# Patient Record
Sex: Male | Born: 1970 | Race: Black or African American | Hispanic: No | Marital: Single | Smoking: Current every day smoker
Health system: Southern US, Community
[De-identification: ages and names within clinical notes are randomized; demographics above are authoritative.]

## PROBLEM LIST (undated history)

## (undated) DIAGNOSIS — C801 Malignant (primary) neoplasm, unspecified: Secondary | ICD-10-CM

## (undated) DIAGNOSIS — I10 Essential (primary) hypertension: Secondary | ICD-10-CM

---

## 2007-06-28 ENCOUNTER — Emergency Department (HOSPITAL_COMMUNITY): Admission: EM | Admit: 2007-06-28 | Discharge: 2007-06-28 | Payer: Self-pay | Admitting: Family Medicine
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
## 2008-10-02 ENCOUNTER — Emergency Department (HOSPITAL_COMMUNITY): Admission: EM | Admit: 2008-10-02 | Discharge: 2008-10-02 | Payer: Self-pay | Admitting: Family Medicine
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
## 2014-02-28 ENCOUNTER — Encounter (HOSPITAL_COMMUNITY): Payer: Self-pay | Admitting: Emergency Medicine

## 2014-02-28 ENCOUNTER — Emergency Department (HOSPITAL_COMMUNITY)
Admission: EM | Admit: 2014-02-28 | Discharge: 2014-03-01 | Disposition: A | Discharge status: Home / Self Care | Payer: Self-pay | Attending: Emergency Medicine | Admitting: Emergency Medicine

## 2014-02-28 DIAGNOSIS — F17200 Nicotine dependence, unspecified, uncomplicated: Secondary | ICD-10-CM | POA: Insufficient documentation

## 2014-02-28 DIAGNOSIS — S058X9A Other injuries of unspecified eye and orbit, initial encounter: Secondary | ICD-10-CM | POA: Insufficient documentation

## 2014-02-28 DIAGNOSIS — R011 Cardiac murmur, unspecified: Secondary | ICD-10-CM | POA: Insufficient documentation

## 2014-02-28 DIAGNOSIS — Y929 Unspecified place or not applicable: Secondary | ICD-10-CM | POA: Insufficient documentation

## 2014-02-28 DIAGNOSIS — IMO0002 Reserved for concepts with insufficient information to code with codable children: Secondary | ICD-10-CM | POA: Insufficient documentation

## 2014-02-28 DIAGNOSIS — S0502XA Injury of conjunctiva and corneal abrasion without foreign body, left eye, initial encounter: Secondary | ICD-10-CM

## 2014-02-28 DIAGNOSIS — Y9389 Activity, other specified: Secondary | ICD-10-CM | POA: Insufficient documentation

## 2014-02-28 DIAGNOSIS — H10213 Acute toxic conjunctivitis, bilateral: Secondary | ICD-10-CM

## 2014-02-28 DIAGNOSIS — H10219 Acute toxic conjunctivitis, unspecified eye: Secondary | ICD-10-CM | POA: Insufficient documentation

## 2014-02-28 MED ORDER — SODIUM CHLORIDE 0.9 % IV SOLN
INTRAVENOUS | Status: AC
Start: 2014-02-28 — End: 2014-03-01
  Administered 2014-02-28: 2000 mL via INTRAVENOUS

## 2014-02-28 MED ORDER — TETRACAINE HCL 0.5 % OP SOLN
2.0000 [drp] | Freq: Once | OPHTHALMIC | Status: AC
  Administered 2014-02-28: 2 [drp] via OPHTHALMIC
  Filled 2014-02-28: qty 2

## 2014-02-28 NOTE — ED Notes (Signed)
pH of both eyes checked.  pH is between 7 and 8 in each eye
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
## 2014-02-28 NOTE — ED Notes (Signed)
Patient states that he was cleaning the outside of a house with Awesome and states that the mist got in his eyes. States that his eyes began bothering him this evening.
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
## 2014-03-01 MED ORDER — HYDROCODONE-ACETAMINOPHEN 5-325 MG PO TABS: 1.0000 | ORAL_TABLET | ORAL | Status: DC | PRN

## 2014-03-01 MED ORDER — ERYTHROMYCIN 5 MG/GM OP OINT
TOPICAL_OINTMENT | Freq: Four times a day (QID) | OPHTHALMIC | Status: DC
  Administered 2014-03-01: 1 via OPHTHALMIC
  Filled 2014-03-01: qty 1

## 2014-03-01 MED ORDER — FLUORESCEIN SODIUM 1 MG OP STRP
1.0000 | ORAL_STRIP | Freq: Once | OPHTHALMIC | Status: DC
  Filled 2014-03-01: qty 1

## 2014-03-01 MED ORDER — PREDNISOLONE ACETATE 1 % OP SUSP
1.0000 [drp] | OPHTHALMIC | Status: DC
  Administered 2014-03-01: 1 [drp] via OPHTHALMIC
  Filled 2014-03-01: qty 1

## 2014-03-01 NOTE — Discharge Instructions (Signed)
Chemical Conjunctivitis Chemical conjunctivitis is an irritation of the underside of the eyelid and the white part of the eye. Conjunctivitis can be caused by infection, allergy or chemical irritation. In your case it has been caused by a chemical irritation of the eye. Symptoms almost always include: tearing, light sensitivity, gritty feeling (sensation) in the eyes, swelling of your eyelids, and often severe pain. In spite of the severe pain, this irritation will run its course and will improve within 24 hours.  HOME CARE INSTRUCTIONS   To ease discomfort apply a cool, clean wash cloth to your eye for 10 to 20 minutes, 3 to 4 times per day.  Do not rub your eyes.  Gently wipe away any discharge from the eyes with moistened tissues.  Wash your hands often with soap and use paper towels to dry.  Sunglasses may be helpful if light bothers your eyes.  Do not use eye make-up.  Do not use contact lenses until the irritation is gone.  Do not operate machinery or drive if your vision is blurred.  Take medications as directed by your caregiver. Artificial tears may ease discomfort.  Avoid the chemical or surroundings which caused the problem. Always use eye protection as necessary. SEEK MEDICAL CARE IF:   The eye is still pink (inflamed) 3 days after beginning treatment.  Pain in the eye increases.  You have discharge coming from either eye.  Your eyelids are stuck together in the morning.  You have an increased sensitivity to light.  An oral temperature above 102 F (38.9 C) develops.  You develop facial pain.  You have any problems that may be related to the medicine you are taking. SEEK IMMEDIATE MEDICAL CARE IF:   Your vision is getting worse.  You develop severe eye pain. MAKE SURE YOU:   Understand these instructions.  Will watch your condition.  Will get help right away if you are not doing well or get worse. Document Released: 07/17/2005 Document Revised:  12/30/2011 Document Reviewed: 05/25/2008 [facility] Patient Information 2014 [place]. Corneal Abrasion The cornea is the clear covering at the front and center of the eye. When looking at the colored portion of the eye (iris), you are looking through the cornea. This very thin tissue is made up of many layers. The surface layer is a single layer of cells (corneal epithelium) and is one of the most sensitive tissues in the body. If a scratch or injury causes the corneal epithelium to come off, it is called a corneal abrasion. If the injury extends to the tissues below the epithelium, the condition is called a corneal ulcer. CAUSES   Scratches.  Trauma.  Foreign body in the eye. Some people have recurrences of abrasions in the area of the original injury even after it has healed (recurrent erosion syndrome). Recurrent erosion syndrome generally improves and goes away with time. SYMPTOMS   Eye pain.  Difficulty or inability to keep the injured eye open.  The eye becomes very sensitive to light.  Recurrent erosions tend to happen suddenly, first thing in the morning, usually after waking up and opening the eye. DIAGNOSIS  Your health care provider can diagnose a corneal abrasion during an eye exam. Dye is usually placed in the eye using a drop or a small paper strip moistened by your tears. When the eye is examined with a special light, the abrasion shows up clearly because of the dye. TREATMENT   Small abrasions may be treated with antibiotic drops or  ointment alone.  Usually a pressure patch is specially applied. Pressure patches prevent the eye from blinking, allowing the corneal epithelium to heal. A pressure patch also reduces the amount of pain present in the eye during healing. Most corneal abrasions heal within 2 3 days with no effect on vision. If the abrasion becomes infected and spreads to the deeper tissues of the cornea, a corneal ulcer can result. This is serious because  it can cause corneal scarring. Corneal scars interfere with light passing through the cornea and cause a loss of vision in the involved eye. HOME CARE INSTRUCTIONS  Use medicine or ointment as directed. Only take over-the-counter or prescription medicines for pain, discomfort, or fever as directed by your health care provider.  Do not drive or operate machinery while your eye is patched. Your ability to judge distances is impaired.  If your health care provider has given you a follow-up appointment, it is very important to keep that appointment. Not keeping the appointment could result in a severe eye infection or permanent loss of vision. If there is any problem keeping the appointment, let your health care provider know. SEEK MEDICAL CARE IF:   You have pain, light sensitivity, and a scratchy feeling in one eye or both eyes.  Your pressure patch keeps loosening up, and you can blink your eye under the patch after treatment.  Any kind of discharge develops from the eye after treatment or if the lids stick together in the morning.  You have the same symptoms in the morning as you did with the original abrasion days, weeks, or months after the abrasion healed. MAKE SURE YOU:   Understand these instructions.  Will watch your condition.  Will get help right away if you are not doing well or get worse. Document Released: 10/04/2000 Document Revised: 07/28/2013 Document Reviewed: 06/14/2013 [facility] Patient Information 2014 [place].

## 2014-03-01 NOTE — ED Notes (Signed)
Discharge instructions given to family member.  Voiced understanding.

## 2014-03-01 NOTE — ED Notes (Signed)
Waiting on medication from the pharmacy 

## 2014-03-03 NOTE — ED Provider Notes (Signed)
  Medical screening examination/treatment/procedure(s) were performed by non-physician practitioner and as supervising physician I was immediately available for consultation/collaboration.   EKG Interpretation None         Carmin Muskrat, MD 03/03/14 [phone]

## 2021-11-21 ENCOUNTER — Encounter (HOSPITAL_BASED_OUTPATIENT_CLINIC_OR_DEPARTMENT_OTHER): Payer: Self-pay | Admitting: *Deleted

## 2021-11-21 ENCOUNTER — Other Ambulatory Visit: Payer: Self-pay

## 2021-11-21 ENCOUNTER — Emergency Department (HOSPITAL_BASED_OUTPATIENT_CLINIC_OR_DEPARTMENT_OTHER): Payer: Self-pay

## 2021-11-21 ENCOUNTER — Emergency Department (HOSPITAL_BASED_OUTPATIENT_CLINIC_OR_DEPARTMENT_OTHER)
Admission: EM | Admit: 2021-11-21 | Discharge: 2021-11-21 | Disposition: A | Discharge status: Home / Self Care | Payer: Self-pay | Attending: Emergency Medicine | Admitting: Emergency Medicine

## 2021-11-21 DIAGNOSIS — K859 Acute pancreatitis without necrosis or infection, unspecified: Secondary | ICD-10-CM | POA: Insufficient documentation

## 2021-11-21 LAB — CBC
HCT: 41 % (ref 39.0–52.0)
Hemoglobin: 13.7 g/dL (ref 13.0–17.0)
MCH: 27.4 pg (ref 26.0–34.0)
MCHC: 33.4 g/dL (ref 30.0–36.0)
MCV: 82 fL (ref 80.0–100.0)
Platelets: 287 10*3/uL (ref 150–400)
RBC: 5 MIL/uL (ref 4.22–5.81)
RDW: 14.6 % (ref 11.5–15.5)
WBC: 5.6 10*3/uL (ref 4.0–10.5)
nRBC: 0 % (ref 0.0–0.2)

## 2021-11-21 LAB — COMPREHENSIVE METABOLIC PANEL
ALT: 1187 U/L — ABNORMAL HIGH (ref 0–44)
AST: 537 U/L — ABNORMAL HIGH (ref 15–41)
Albumin: 4.2 g/dL (ref 3.5–5.0)
Alkaline Phosphatase: 656 U/L — ABNORMAL HIGH (ref 38–126)
Anion gap: 11 (ref 5–15)
BUN: 9 mg/dL (ref 6–20)
CO2: 26 mmol/L (ref 22–32)
Calcium: 9.8 mg/dL (ref 8.9–10.3)
Chloride: 97 mmol/L — ABNORMAL LOW (ref 98–111)
Creatinine, Ser: 0.78 mg/dL (ref 0.61–1.24)
GFR, Estimated: >60 mL/min (ref >60)
Glucose, Bld: 329 mg/dL — ABNORMAL HIGH (ref 70–99)
Potassium: 3.4 mmol/L — ABNORMAL LOW (ref 3.5–5.1)
Sodium: 134 mmol/L — ABNORMAL LOW (ref 135–145)
Total Bilirubin: 5.2 mg/dL — ABNORMAL HIGH (ref 0.3–1.2)
Total Protein: 7.9 g/dL (ref 6.5–8.1)

## 2021-11-21 LAB — LIPASE, BLOOD: Lipase: 279 U/L — ABNORMAL HIGH (ref 11–51)

## 2021-11-21 MED ORDER — IOHEXOL 300 MG/ML  SOLN
100.0000 mL | Freq: Once | INTRAMUSCULAR | Status: AC | PRN
  Administered 2021-11-21: 100 mL via INTRAVENOUS

## 2021-11-21 NOTE — Discharge Instructions (Addendum)
Advise a clear liquid diet for the next 7 days until seen by the GI doctor.  Return to the ER if you have fevers worsening pain, cannot keep down any fluids or have any additional concerns.

## 2021-11-21 NOTE — ED Triage Notes (Signed)
Pt is here for evaluation for abdominal pain x1 month.  Pain is waxing and weaning and hurts more at night.  Pt reports that this feels like a hunger pain but when he eats he becomes nauseated and has vomiting.  Pt reports an estimated weight loss of 20lbs and that he has only been able to keep down water and crackers and soup for the past month.  Pt reports last bm was this am and he has has regular BM with some formed and some diarrhea. No fever with this.

## 2021-11-21 NOTE — ED Provider Notes (Signed)
[place] EMERGENCY DEPT Provider Note   CSN: [id number] Arrival date & time: 11/21/21  0900     History  Chief Complaint  Patient presents with   Abdominal Pain    Collin Johnson. is a 51 y.o. male.  Patient presents with chief complaint of abdominal pain.  He states that this pain has been intermittent for the past month.  Aspiration of this pain a few days ago and presents to the ER.  Currently denies any pain he states he has not had any pain today.  He states he lost about 20 pounds of weight in the past month.  He has had intermittent vomiting but no diarrhea.  Denies any vomiting today denies any fevers or cough or diarrhea.      Home Medications Prior to Admission medications   Medication Sig Start Date End Date Taking? Authorizing Provider  HYDROcodone-acetaminophen (NORCO/VICODIN) 5-325 MG per tablet Take 1-2 tablets by mouth every 4 (four) hours as needed for severe pain. 03/01/14   Etta Quill, NP  Pseudoephedrine-Acetaminophen (SINUS PO) Take 1 tablet by mouth 2 (two) times daily as needed (for congestion).    [provider]      Allergies    Patient has no known allergies.    Review of Systems   Review of Systems  Constitutional:  Negative for fever.  HENT:  Negative for ear pain and sore throat.   Eyes:  Negative for pain.  Respiratory:  Negative for cough.   Cardiovascular:  Negative for chest pain.  Gastrointestinal:  Positive for abdominal pain.  Genitourinary:  Negative for flank pain.  Musculoskeletal:  Negative for back pain.  Skin:  Negative for color change and rash.  Neurological:  Negative for syncope.  All other systems reviewed and are negative.  Physical Exam Updated Vital Signs BP (!) 135/96    Pulse 62    Temp 98.6 F (37 C) (Oral)    Resp 18    Wt 103 kg    SpO2 99%  Physical Exam Constitutional:      Appearance: He is well-developed.  HENT:     Head: Normocephalic.     Nose: Nose normal.  Eyes:      Extraocular Movements: Extraocular movements intact.  Cardiovascular:     Rate and Rhythm: Normal rate.  Pulmonary:     Effort: Pulmonary effort is normal.  Abdominal:     Tenderness: There is no abdominal tenderness. There is no guarding or rebound.  Skin:    Coloration: Skin is not jaundiced.  Neurological:     Mental Status: He is alert. Mental status is at baseline.    ED Results / Procedures / Treatments   Labs (all labs ordered are listed, but only abnormal results are displayed) Labs Reviewed  LIPASE, BLOOD - Abnormal; Notable for the following components:      Result Value   Lipase 279 (*)    All other components within normal limits  COMPREHENSIVE METABOLIC PANEL - Abnormal; Notable for the following components:   Sodium 134 (*)    Potassium 3.4 (*)    Chloride 97 (*)    Glucose, Bld 329 (*)    AST 537 (*)    ALT 1,187 (*)    Alkaline Phosphatase 656 (*)    Total Bilirubin 5.2 (*)    All other components within normal limits  CBC  URINALYSIS, ROUTINE W REFLEX MICROSCOPIC    EKG EKG Interpretation  Date/Time:  Wednesday November 21 2021  09:13:52 EST Ventricular Rate:  83 PR Interval:  134 QRS Duration: 90 QT Interval:  366 QTC Calculation: 430 R Axis:   38 Text Interpretation: Sinus rhythm Borderline repolarization abnormality Confirmed by Thamas Jaegers ([id number]) on 11/21/2021 10:58:45 AM  Radiology CT Abdomen Pelvis W Contrast  Result Date: 11/21/2021 CLINICAL DATA:  Abdominal pain EXAM: CT ABDOMEN AND PELVIS WITH CONTRAST TECHNIQUE: Multidetector CT imaging of the abdomen and pelvis was performed using the standard protocol following bolus administration of intravenous contrast. RADIATION DOSE REDUCTION: This exam was performed according to the departmental dose-optimization program which includes automated exposure control, adjustment of the mA and/or kV according to patient size and/or use of iterative reconstruction technique. CONTRAST:  146mL OMNIPAQUE IOHEXOL  300 MG/ML  SOLN COMPARISON:  None. FINDINGS: Lower chest: No acute abnormality. Hepatobiliary: Cyst in right hepatic lobe measures 3.9 cm, image 20/2. No suspicious focal liver lesion identified. Gallbladder appears within normal limits. Mild intrahepatic bile duct dilatation. There is increase caliber of the proximal common bile duct measuring up to 1.2 cm with abrupt termination at the level of the head of pancreas. Pancreas: Pancreatic edema with peripancreatic fat stranding identified concerning for acute pancreatitis. Increase caliber of the main pancreatic duct is identified at the neck and head of pancreas measuring up to 8 mm, image 31/2. Within the uncinate process of the pancreas there is a cystic lesion with a maximum dimension of 2.8 cm, image 59/5. Anterior to this cystic lesion is a poorly marginated, focal low-attenuation area measuring 2.5 x 1.7 by 2.3 cm, image 36/2. Spleen: Normal in size without focal abnormality. Adrenals/Urinary Tract: Nodular enlargement of bilateral adrenal glands noted, likely reflecting underlying adenomas. No mass or hydronephrosis identified bilaterally. Urinary bladder is unremarkable. Stomach/Bowel: The stomach appears normal. The appendix is visualized and is within normal limits. No bowel wall thickening, inflammation, or distension Vascular/Lymphatic: No significant vascular findings are present. Prominent upper abdominal lymph nodes are identified. Within the peripancreatic region there is a 1.8 cm short axis lymph node, image 26/2. Reproductive: Prostate is unremarkable. Other: No free fluid or fluid collections. Musculoskeletal: No acute or significant osseous findings. IMPRESSION: 1. Findings compatible with acute pancreatitis. 2. There is a cystic lesion within the uncinate process of the pancreas with a poorly marginated, focal low-attenuation area anterior to this cystic lesion. This is nonspecific this is a nonspecific finding in the setting of pancreatitis,  and may represent pseudo cyst with focal pancreatic edema/necrosis. Recommend short-term interval follow-up with pancreas protocol MRI in 1 month following resolution of acute pancreatitis. 3. Mild common bile duct and intrahepatic duct dilatation, likely reflecting mass effect secondary to pancreatic edema. No CT visible stones identified within the CBD. 4. Enlarged upper abdominal lymph nodes, likely reactive. 5. Nodular enlargement of bilateral adrenal glands likely reflecting underlying adenomas. Electronically Signed   By: Kerby Moors M.D.   On: 11/21/2021 10:40    Procedures Procedures    Medications Ordered in ED Medications  iohexol (OMNIPAQUE) 300 MG/ML solution 100 mL (100 mLs Intravenous Contrast Given 11/21/21 1010)    ED Course/ Medical Decision Making/ A&P                           Medical Decision Making Amount and/or Complexity of Data Reviewed Labs: ordered. Radiology: ordered.  Risk Prescription drug management.   Attempted review of records shows no prior visits to primary care doctors.  Work-up concerning for elevated lipase to 79 mildly elevated.  AST ALT also elevated 511 100.  T bili elevated 5.2.  CT abdomen pelvis shows normal.  Gallbladder but mildly dilated common bile duct.  Abrupt narrowing noted per radiology.  Pancreatic cyst also noted per radiology.  Case discussed with on-call GI Dr. Watt Climes, who will arrange for close follow-up at his facility this week.  Will obtain outpatient labs and outpatient MRCP per plan.  Patient remains symptom-free no tenderness no guarding.  Discharged home in stable condition advised clear liquid diet until followed up with GI this week.  Advised immediate return if he has fevers worsening pain or any additional concerns.        Final Clinical Impression(s) / ED Diagnoses Final diagnoses:  Acute pancreatitis, unspecified complication status, unspecified pancreatitis type    Rx / DC Orders ED Discharge Orders      None         Luna Fuse, MD 11/21/21 1116

## 2021-12-18 ENCOUNTER — Encounter (HOSPITAL_COMMUNITY): Payer: Self-pay | Admitting: Radiology

## 2022-01-22 ENCOUNTER — Ambulatory Visit (INDEPENDENT_AMBULATORY_CARE_PROVIDER_SITE_OTHER): Payer: Self-pay | Admitting: Primary Care

## 2022-01-22 ENCOUNTER — Encounter: Payer: Self-pay | Admitting: *Deleted

## 2022-01-22 NOTE — Progress Notes (Signed)
Called patient to schedule new patient appointment after referral reviewed today. No response. Voicemail left requesting call back. ? ?Per chart review it does look like patient has been seen by an oncologist in another health system. Will await patient contact. ? ?Oncology Nurse Navigator Documentation ? ? ?  01/22/2022  ?  3:15 PM  ?Oncology Nurse Navigator Flowsheets  ?Navigator Follow Up Date: 01/23/2022  ?Navigator Follow Up Reason: Patient Call  ?Navigator Location [facility]  ?Referral Date to RadOnc/MedOnc 01/22/2022  ?Navigator Encounter Type Introductory Phone Call  ?Time Spent with Patient 15  ?  ?

## 2022-01-23 ENCOUNTER — Encounter: Payer: Self-pay | Admitting: *Deleted

## 2022-01-23 NOTE — Progress Notes (Signed)
Additional attempt made to contact patient regarding his referral. Message left requesting call back.  ? ?Oncology Nurse Navigator Documentation ? ? ?  01/23/2022  ? 11:30 AM  ?Oncology Nurse Navigator Flowsheets  ?Navigator Follow Up Date: 01/24/2022  ?Navigator Follow Up Reason: Patient Call  ?Navigator Location [facility]  ?Referral Date to RadOnc/MedOnc 01/22/2022  ?Navigator Encounter Type Introductory Phone Call  ?Time Spent with Patient 15  ?  ?

## 2022-01-24 ENCOUNTER — Encounter: Payer: Self-pay | Admitting: *Deleted

## 2022-01-24 NOTE — Progress Notes (Signed)
Attempted to call patient again today for new patient appointment. Unable to reach patient. Left message indicating that we would close the referral but that if he wished to be scheduled to please call us back. Referral will be closed and referring office will be notified.  ? ?Oncology Nurse Navigator Documentation ? ? ?  01/24/2022  ? 12:30 PM  ?Oncology Nurse Navigator Flowsheets  ?Navigator Location [facility]  ?Referral Date to RadOnc/MedOnc 01/22/2022  ?Navigator Encounter Type Introductory Phone Call  ?Time Spent with Patient 15  ?  ?

## 2022-02-24 ENCOUNTER — Emergency Department (HOSPITAL_COMMUNITY)
Admission: EM | Admit: 2022-02-24 | Discharge: 2022-02-24 | Disposition: A | Discharge status: Home / Self Care | Payer: BLUE CROSS/BLUE SHIELD | Attending: Emergency Medicine | Admitting: Emergency Medicine

## 2022-02-24 DIAGNOSIS — E876 Hypokalemia: Secondary | ICD-10-CM | POA: Insufficient documentation

## 2022-02-24 DIAGNOSIS — T7840XA Allergy, unspecified, initial encounter: Secondary | ICD-10-CM | POA: Insufficient documentation

## 2022-02-24 DIAGNOSIS — R531 Weakness: Secondary | ICD-10-CM | POA: Diagnosis present

## 2022-02-24 LAB — CBC WITH DIFFERENTIAL/PLATELET
Abs Immature Granulocytes: 0 10*3/uL (ref 0.00–0.07)
Band Neutrophils: 5 %
Basophils Absolute: 0 10*3/uL (ref 0.0–0.1)
Basophils Relative: 0 %
Eosinophils Absolute: 0 10*3/uL (ref 0.0–0.5)
Eosinophils Relative: 0 %
HCT: 41.1 % (ref 39.0–52.0)
Hemoglobin: 14.5 g/dL (ref 13.0–17.0)
Lymphocytes Relative: 2 %
Lymphs Abs: 0.5 10*3/uL — ABNORMAL LOW (ref 0.7–4.0)
MCH: 30.4 pg (ref 26.0–34.0)
MCHC: 35.3 g/dL (ref 30.0–36.0)
MCV: 86.2 fL (ref 80.0–100.0)
Monocytes Absolute: 0.7 10*3/uL (ref 0.1–1.0)
Monocytes Relative: 3 %
Neutro Abs: 22.1 10*3/uL — ABNORMAL HIGH (ref 1.7–7.7)
Neutrophils Relative %: 90 %
Platelets: 203 10*3/uL (ref 150–400)
RBC: 4.77 MIL/uL (ref 4.22–5.81)
RDW: 12.9 % (ref 11.5–15.5)
WBC: 23.3 10*3/uL — ABNORMAL HIGH (ref 4.0–10.5)
nRBC: 0 % (ref 0.0–0.2)

## 2022-02-24 LAB — BASIC METABOLIC PANEL
Anion gap: 13 (ref 5–15)
BUN: 14 mg/dL (ref 6–20)
CO2: 23 mmol/L (ref 22–32)
Calcium: 8.7 mg/dL — ABNORMAL LOW (ref 8.9–10.3)
Chloride: 101 mmol/L (ref 98–111)
Creatinine, Ser: 0.93 mg/dL (ref 0.61–1.24)
GFR, Estimated: >60 mL/min (ref >60)
Glucose, Bld: 167 mg/dL — ABNORMAL HIGH (ref 70–99)
Potassium: 2.5 mmol/L — CL (ref 3.5–5.1)
Sodium: 137 mmol/L (ref 135–145)

## 2022-02-24 MED ORDER — SODIUM CHLORIDE 0.9 % IV BOLUS
1000.0000 mL | Freq: Once | INTRAVENOUS | Status: AC
  Administered 2022-02-24: 1000 mL via INTRAVENOUS

## 2022-02-24 MED ORDER — POTASSIUM CHLORIDE 10 MEQ/100ML IV SOLN
10.0000 meq | Freq: Once | INTRAVENOUS | Status: AC
  Administered 2022-02-24: 10 meq via INTRAVENOUS
  Filled 2022-02-24: qty 100

## 2022-02-24 MED ORDER — FAMOTIDINE IN NACL 20-0.9 MG/50ML-% IV SOLN
20.0000 mg | Freq: Once | INTRAVENOUS | Status: AC
Start: 2022-02-24 — End: 2022-02-24
  Administered 2022-02-24: 20 mg via INTRAVENOUS
  Filled 2022-02-24: qty 50

## 2022-02-24 MED ORDER — POTASSIUM CHLORIDE 20 MEQ PO PACK
40.0000 meq | PACK | Freq: Two times a day (BID) | ORAL | Status: DC
  Administered 2022-02-24: 40 meq via ORAL
  Filled 2022-02-24 (×2): qty 2

## 2022-02-24 MED ORDER — DIPHENHYDRAMINE HCL 50 MG/ML IJ SOLN
25.0000 mg | Freq: Once | INTRAMUSCULAR | Status: AC
  Administered 2022-02-24: 25 mg via INTRAVENOUS
  Filled 2022-02-24: qty 1

## 2022-02-24 MED ORDER — POTASSIUM CHLORIDE CRYS ER 20 MEQ PO TBCR
40.0000 meq | EXTENDED_RELEASE_TABLET | Freq: Once | ORAL | Status: DC
Start: 2022-02-24 — End: 2022-02-24
  Filled 2022-02-24: qty 2

## 2022-02-24 MED ORDER — FAMOTIDINE 20 MG PO TABS: 20.0000 mg | ORAL_TABLET | Freq: Two times a day (BID) | ORAL | 0 refills | Status: AC

## 2022-02-24 MED ORDER — METHYLPREDNISOLONE SODIUM SUCC 125 MG IJ SOLR
125.0000 mg | Freq: Once | INTRAMUSCULAR | Status: AC
  Administered 2022-02-24: 125 mg via INTRAVENOUS
  Filled 2022-02-24: qty 2

## 2022-02-24 NOTE — ED Triage Notes (Addendum)
Pt from home, pancreatic cancer March 2023, taking chemo. Did chemo Wednesday. BP was in 70s when EMS got there. 538m fluid given by EMS and bumped up to 969Vsystolic. Zofran also given.  ?

## 2022-02-24 NOTE — ED Notes (Addendum)
Duncan Dull- sister [phone] and mom left bedside and will return when dispo decided ?

## 2022-02-24 NOTE — Discharge Instructions (Signed)
Increase your potassium so you are taking 2 pills a day for the next 5 days.  Follow-up with your oncologist this week.  If you develop that rash again take some Benadryl and get seen by a physician.  Do not eat those grapes anymore ?

## 2022-02-24 NOTE — ED Provider Notes (Signed)
?[facility] DEPT ?Provider Note ? ? ?CSN: [id number] ?Arrival date & time: 02/24/22  1913 ? ?  ? ?History ? ?Chief Complaint  ?Patient presents with  ? Weakness  ? ? ?Collin Johnson. is a 51 y.o. male. ? ?Patient states he was eating some grapes today and started to get a rash and was very weak and dizzy.  When paramedics arrived he was hypotensive and they gave him 750 cc of normal saline he was also given Benadryl.  When he arrived in the emergency department he was normotensive no longer had the rash.  Patient has a history of pancreatic cancer and is getting chemo ? ?The history is provided by the patient and medical records. No language interpreter was used.  ?Weakness ?Severity:  Moderate ?Onset quality:  Sudden ?Timing:  Constant ?Progression:  Resolved ?Chronicity:  New ?Context: not alcohol use   ?Relieved by:  Nothing ?Worsened by:  Nothing ?Ineffective treatments:  None tried ?Associated symptoms: no abdominal pain, no chest pain, no cough, no diarrhea, no frequency, no headaches and no seizures   ? ?  ? ?Home Medications ?Prior to Admission medications   ?Medication Sig Start Date End Date Taking? Authorizing Provider  ?famotidine (PEPCID) 20 MG tablet Take 1 tablet (20 mg total) by mouth 2 (two) times daily. 02/24/22  Yes Milton Ferguson, MD  ?HYDROcodone-acetaminophen (NORCO/VICODIN) 5-325 MG per tablet Take 1-2 tablets by mouth every 4 (four) hours as needed for severe pain. 03/01/14   Etta Quill, NP  ?Pseudoephedrine-Acetaminophen (SINUS PO) Take 1 tablet by mouth 2 (two) times daily as needed (for congestion).    [provider]  ?   ? ?Allergies    ?Patient has no known allergies.   ? ?Review of Systems   ?Review of Systems  ?Constitutional:  Negative for appetite change and fatigue.  ?HENT:  Negative for congestion, ear discharge and sinus pressure.   ?Eyes:  Negative for discharge.  ?Respiratory:  Negative for cough.   ?Cardiovascular:  Negative for chest  pain.  ?Gastrointestinal:  Negative for abdominal pain and diarrhea.  ?Genitourinary:  Negative for frequency and hematuria.  ?Musculoskeletal:  Negative for back pain.  ?Skin:  Negative for rash.  ?Neurological:  Positive for weakness. Negative for seizures and headaches.  ?Psychiatric/Behavioral:  Negative for hallucinations.   ? ?Physical Exam ?Updated Vital Signs ?BP 110/82   Pulse 77   Temp 98.8 ?F (37.1 ?C) (Oral)   Resp 15   Ht '5\' 8"'$  (1.727 m)   Wt 77.1 kg   SpO2 99%   BMI 25.85 kg/m?  ?Physical Exam ?Vitals and nursing note reviewed.  ?Constitutional:   ?   Appearance: He is well-developed.  ?HENT:  ?   Head: Normocephalic.  ?   Nose: Nose normal.  ?Eyes:  ?   General: No scleral icterus. ?   Conjunctiva/sclera: Conjunctivae normal.  ?Neck:  ?   Thyroid: No thyromegaly.  ?Cardiovascular:  ?   Rate and Rhythm: Normal rate and regular rhythm.  ?   Heart sounds: No murmur heard. ?  No friction rub. No gallop.  ?Pulmonary:  ?   Breath sounds: No stridor. No wheezing or rales.  ?Chest:  ?   Chest wall: No tenderness.  ?Abdominal:  ?   General: There is no distension.  ?   Tenderness: There is no abdominal tenderness. There is no rebound.  ?Musculoskeletal:     ?   General: Normal range of motion.  ?  Cervical back: Neck supple.  ?Lymphadenopathy:  ?   Cervical: No cervical adenopathy.  ?Skin: ?   Findings: No erythema or rash.  ?Neurological:  ?   Mental Status: He is alert and oriented to person, place, and time.  ?   Motor: No abnormal muscle tone.  ?   Coordination: Coordination normal.  ?Psychiatric:     ?   Behavior: Behavior normal.  ? ? ?ED Results / Procedures / Treatments   ?Labs ?(all labs ordered are listed, but only abnormal results are displayed) ?Labs Reviewed  ?CBC WITH DIFFERENTIAL/PLATELET - Abnormal; Notable for the following components:  ?    Result Value  ? WBC 23.3 (*)   ? Neutro Abs 22.1 (*)   ? Lymphs Abs 0.5 (*)   ? All other components within normal limits  ?BASIC METABOLIC PANEL  - Abnormal; Notable for the following components:  ? Potassium 2.5 (*)   ? Glucose, Bld 167 (*)   ? Calcium 8.7 (*)   ? All other components within normal limits  ? ? ?EKG ?None ? ?Radiology ?No results found. ? ?Procedures ?Procedures  ? ? ?Medications Ordered in ED ?Medications  ?potassium chloride 10 mEq in 100 mL IVPB (10 mEq Intravenous New Bag/Given 02/24/22 2202)  ?potassium chloride (KLOR-CON) packet 40 mEq (40 mEq Oral Given 02/24/22 2037)  ?methylPREDNISolone sodium succinate (SOLU-MEDROL) 125 mg/2 mL injection 125 mg (125 mg Intravenous Given 02/24/22 1946)  ?diphenhydrAMINE (BENADRYL) injection 25 mg (25 mg Intravenous Given 02/24/22 1945)  ?sodium chloride 0.9 % bolus 1,000 mL (0 mLs Intravenous Stopped 02/24/22 2031)  ?famotidine (PEPCID) IVPB 20 mg premix (0 mg Intravenous Stopped 02/24/22 2016)  ?potassium chloride 10 mEq in 100 mL IVPB (0 mEq Intravenous Stopped 02/24/22 2202)  ? ? ?ED Course/ Medical Decision Making/ A&P ?Patient with allergic reaction and hypokalemia.  Patient improved with Benadryl normal saline steroids and Pepcid. ?Medical Decision Making ?Amount and/or Complexity of Data Reviewed ?Labs: ordered. ?ECG/medicine tests: ordered. ? ?Risk ?Prescription drug management. ? ?This patient presents to the ED for concern of rash weakness, this involves an extensive number of treatment options, and is a complaint that carries with it a high risk of complications and morbidity.  The differential diagnosis includes allergic reaction sepsis ? ? ?Co morbidities that complicate the patient evaluation ? ?Pancreatic cancer ? ? ?Additional history obtained: ? ?Additional history obtained from oncologist from wife ?External records from outside source obtained and reviewed including hospital record ? ? ?Lab Tests: ? ?I Ordered, and personally interpreted labs.  The pertinent results include: CBC shows white count 23,000 hemoglobin normal potassium low at 2.5 ? ? ?Imaging Studies ordered: ? ?No x-rays ? ?Cardiac  Monitoring: / EKG: ? ?The patient was maintained on a cardiac monitor.  I personally viewed and interpreted the cardiac monitored which showed an underlying rhythm of: Normal sinus rhythm ? ? ?Consultations Obtained: ? ?No consult ? ?Problem List / ED Course / Critical interventions / Medication management ? ?Rash allergic reaction, pancreatic cancer ?I ordered medication including steroids Benadryl Pepcid for allergic rash ?Reevaluation of the patient after these medicines showed that the patient resolved ?I have reviewed the patients home medicines and have made adjustments as needed ? ? ?Social Determinants of Health: ? ?None ? ? ?Test / Admission - Considered: ? ?None ? ?Patient with allergic reaction.  Patient has been observed for 5 hours and is stable for discharge.  He is placed on Pepcid and will follow-up with his doctor this  week.  He was also given potassium for the hypokalemia and will start taking twice his dose for 5 days ? ? ? ? ? ? ? ?Final Clinical Impression(s) / ED Diagnoses ?Final diagnoses:  ?Allergic reaction, initial encounter  ? ? ?Rx / DC Orders ?ED Discharge Orders   ? ?      Ordered  ?  famotidine (PEPCID) 20 MG tablet  2 times daily       ? 02/24/22 2249  ? ?  ?  ? ?  ? ? ?  ?Milton Ferguson, MD ?02/27/22 1048 ? ?

## 2022-08-17 ENCOUNTER — Emergency Department (HOSPITAL_COMMUNITY): Payer: BLUE CROSS/BLUE SHIELD

## 2022-08-17 ENCOUNTER — Encounter (HOSPITAL_COMMUNITY): Payer: Self-pay | Admitting: Emergency Medicine

## 2022-08-17 ENCOUNTER — Emergency Department (HOSPITAL_COMMUNITY)
Admission: EM | Admit: 2022-08-17 | Discharge: 2022-08-18 | Disposition: A | Discharge status: Other Acute Inpatient Hospital | Payer: BLUE CROSS/BLUE SHIELD | Attending: Emergency Medicine | Admitting: Emergency Medicine

## 2022-08-17 ENCOUNTER — Other Ambulatory Visit: Payer: Self-pay

## 2022-08-17 DIAGNOSIS — K668 Other specified disorders of peritoneum: Secondary | ICD-10-CM

## 2022-08-17 DIAGNOSIS — E876 Hypokalemia: Secondary | ICD-10-CM | POA: Diagnosis not present

## 2022-08-17 DIAGNOSIS — K859 Acute pancreatitis without necrosis or infection, unspecified: Secondary | ICD-10-CM | POA: Diagnosis not present

## 2022-08-17 DIAGNOSIS — D649 Anemia, unspecified: Secondary | ICD-10-CM

## 2022-08-17 DIAGNOSIS — E119 Type 2 diabetes mellitus without complications: Secondary | ICD-10-CM | POA: Insufficient documentation

## 2022-08-17 DIAGNOSIS — C259 Malignant neoplasm of pancreas, unspecified: Secondary | ICD-10-CM | POA: Diagnosis not present

## 2022-08-17 DIAGNOSIS — F17210 Nicotine dependence, cigarettes, uncomplicated: Secondary | ICD-10-CM | POA: Insufficient documentation

## 2022-08-17 DIAGNOSIS — R7401 Elevation of levels of liver transaminase levels: Secondary | ICD-10-CM | POA: Diagnosis not present

## 2022-08-17 DIAGNOSIS — D72829 Elevated white blood cell count, unspecified: Secondary | ICD-10-CM | POA: Diagnosis not present

## 2022-08-17 DIAGNOSIS — K529 Noninfective gastroenteritis and colitis, unspecified: Secondary | ICD-10-CM

## 2022-08-17 DIAGNOSIS — R7989 Other specified abnormal findings of blood chemistry: Secondary | ICD-10-CM

## 2022-08-17 DIAGNOSIS — R109 Unspecified abdominal pain: Secondary | ICD-10-CM | POA: Diagnosis present

## 2022-08-17 LAB — COMPREHENSIVE METABOLIC PANEL
ALT: 107 U/L — ABNORMAL HIGH (ref 0–44)
AST: 396 U/L — ABNORMAL HIGH (ref 15–41)
Albumin: 2.9 g/dL — ABNORMAL LOW (ref 3.5–5.0)
Alkaline Phosphatase: 497 U/L — ABNORMAL HIGH (ref 38–126)
Anion gap: 11 (ref 5–15)
BUN: 7 mg/dL (ref 6–20)
CO2: 29 mmol/L (ref 22–32)
Calcium: 8.3 mg/dL — ABNORMAL LOW (ref 8.9–10.3)
Chloride: 95 mmol/L — ABNORMAL LOW (ref 98–111)
Creatinine, Ser: 0.57 mg/dL — ABNORMAL LOW (ref 0.61–1.24)
GFR, Estimated: >60 mL/min (ref >60)
Glucose, Bld: 153 mg/dL — ABNORMAL HIGH (ref 70–99)
Potassium: <2 mmol/L — CL (ref 3.5–5.1)
Sodium: 135 mmol/L (ref 135–145)
Total Bilirubin: 2.3 mg/dL — ABNORMAL HIGH (ref 0.3–1.2)
Total Protein: 6.7 g/dL (ref 6.5–8.1)

## 2022-08-17 LAB — CBC WITH DIFFERENTIAL/PLATELET
Abs Immature Granulocytes: 0.09 10*3/uL — ABNORMAL HIGH (ref 0.00–0.07)
Basophils Absolute: 0 10*3/uL (ref 0.0–0.1)
Basophils Relative: 0 %
Eosinophils Absolute: 0 10*3/uL (ref 0.0–0.5)
Eosinophils Relative: 0 %
HCT: 33.3 % — ABNORMAL LOW (ref 39.0–52.0)
Hemoglobin: 11.3 g/dL — ABNORMAL LOW (ref 13.0–17.0)
Immature Granulocytes: 1 %
Lymphocytes Relative: 2 %
Lymphs Abs: 0.3 10*3/uL — ABNORMAL LOW (ref 0.7–4.0)
MCH: 29.2 pg (ref 26.0–34.0)
MCHC: 33.9 g/dL (ref 30.0–36.0)
MCV: 86 fL (ref 80.0–100.0)
Monocytes Absolute: 0.4 10*3/uL (ref 0.1–1.0)
Monocytes Relative: 3 %
Neutro Abs: 14.5 10*3/uL — ABNORMAL HIGH (ref 1.7–7.7)
Neutrophils Relative %: 94 %
Platelets: 244 10*3/uL (ref 150–400)
RBC: 3.87 MIL/uL — ABNORMAL LOW (ref 4.22–5.81)
RDW: 14.3 % (ref 11.5–15.5)
WBC: 15.3 10*3/uL — ABNORMAL HIGH (ref 4.0–10.5)
nRBC: 0 % (ref 0.0–0.2)

## 2022-08-17 LAB — LIPASE, BLOOD: Lipase: 27 U/L (ref 11–51)

## 2022-08-17 LAB — MAGNESIUM: Magnesium: 1.4 mg/dL — ABNORMAL LOW (ref 1.7–2.4)

## 2022-08-17 MED ORDER — MAGNESIUM SULFATE 2 GM/50ML IV SOLN
2.0000 g | Freq: Once | INTRAVENOUS | Status: AC
  Administered 2022-08-17: 2 g via INTRAVENOUS
  Filled 2022-08-17: qty 50

## 2022-08-17 MED ORDER — PIPERACILLIN-TAZOBACTAM 3.375 G IVPB
3.3750 g | Freq: Three times a day (TID) | INTRAVENOUS | Status: DC
  Administered 2022-08-18: 3.375 g via INTRAVENOUS
  Filled 2022-08-17: qty 50

## 2022-08-17 MED ORDER — IOHEXOL 9 MG/ML PO SOLN: 500.0000 mL | ORAL | Status: DC

## 2022-08-17 MED ORDER — IOHEXOL 9 MG/ML PO SOLN: 500.0000 mL | ORAL | Status: AC

## 2022-08-17 MED ORDER — IOHEXOL 300 MG/ML  SOLN
100.0000 mL | Freq: Once | INTRAMUSCULAR | Status: AC | PRN
  Administered 2022-08-17: 100 mL via INTRAVENOUS

## 2022-08-17 MED ORDER — PIPERACILLIN-TAZOBACTAM 3.375 G IVPB 30 MIN
3.3750 g | Freq: Once | INTRAVENOUS | Status: AC
  Administered 2022-08-17: 3.375 g via INTRAVENOUS
  Filled 2022-08-17: qty 50

## 2022-08-17 MED ORDER — LACTATED RINGERS IV SOLN
INTRAVENOUS | Status: DC
  Administered 2022-08-18: 100 mL/h via INTRAVENOUS

## 2022-08-17 MED ORDER — POTASSIUM CHLORIDE 10 MEQ/100ML IV SOLN
10.0000 meq | INTRAVENOUS | Status: AC
  Administered 2022-08-17 (×4): 10 meq via INTRAVENOUS
  Filled 2022-08-17 (×4): qty 100

## 2022-08-17 MED ORDER — HYDROMORPHONE HCL 1 MG/ML IJ SOLN
0.5000 mg | Freq: Once | INTRAMUSCULAR | Status: AC
  Administered 2022-08-17: 0.5 mg via INTRAVENOUS
  Filled 2022-08-17: qty 1

## 2022-08-17 MED ORDER — PROCHLORPERAZINE EDISYLATE 10 MG/2ML IJ SOLN
10.0000 mg | Freq: Four times a day (QID) | INTRAMUSCULAR | Status: DC | PRN
  Administered 2022-08-18: 10 mg via INTRAVENOUS
  Filled 2022-08-17: qty 2

## 2022-08-17 MED ORDER — IOHEXOL 9 MG/ML PO SOLN
ORAL | Status: AC
  Filled 2022-08-17: qty 500

## 2022-08-17 MED ORDER — LACTATED RINGERS IV BOLUS
1000.0000 mL | Freq: Once | INTRAVENOUS | Status: AC
  Administered 2022-08-17: 1000 mL via INTRAVENOUS

## 2022-08-17 MED ORDER — HYDROMORPHONE HCL 1 MG/ML IJ SOLN
1.0000 mg | INTRAMUSCULAR | Status: DC | PRN
  Administered 2022-08-17 – 2022-08-18 (×2): 1 mg via INTRAVENOUS
  Filled 2022-08-17 (×2): qty 1

## 2022-08-17 MED ORDER — ONDANSETRON HCL 4 MG/2ML IJ SOLN
4.0000 mg | Freq: Once | INTRAMUSCULAR | Status: AC
  Administered 2022-08-17: 4 mg via INTRAVENOUS
  Filled 2022-08-17: qty 2

## 2022-08-17 MED ORDER — POTASSIUM CHLORIDE CRYS ER 20 MEQ PO TBCR
40.0000 meq | EXTENDED_RELEASE_TABLET | Freq: Once | ORAL | Status: DC
  Filled 2022-08-17: qty 2

## 2022-08-17 NOTE — ED Provider Notes (Signed)
[facility] DEPT Provider Note   CSN: [id number] Arrival date & time: 08/17/22  1038     History  Chief Complaint  Patient presents with   Abdominal Pain   Nausea   Emesis   Diarrhea    Collin Johnson. is a 51 y.o. male with history of diabetes and pancreatic cancer staged T4 who is following with heme-onc at [facility] in [place] with additional history of biliary dilatation due to pancreatic cancer status post stent, history of hypomagnesia and hypokalemia who presents to the emergency department for evaluation of worsening upper abdominal pain along with nausea and vomiting that started approximately 3 or 4 days ago and has been worsening.  He had 1 episode of diarrhea today.  He denies chest pain, shortness of breath, urinary symptoms.  He has been taking his prescribed nausea medications without any relief.  He states he has not had treatment in about 2 to 3 weeks.  Per chart review, pt is receiving chemotherapy FOLFIRINOX via port.  Patient has been having increased nausea so his oncologist is sentiment prescriptions for both Zofran and Compazine.  His treatment has been delayed on 07/31/2022 due to nausea and fatigue.  He has been having problems with early satiety and had repeat CT CAP on 08/07/2022 for concerns of gastric outlet obstruction which showed pancreatic mass grossly similar in size without evidence of bowel obstruction, and no post complications of biliary duct dilatation with stent.  Suggestive of volume overload with a small volume of ascites.  He skipped his treatment on 10/23 as well due to transportation issues.    Abdominal Pain Associated symptoms: diarrhea and vomiting   Emesis Associated symptoms: abdominal pain and diarrhea   Diarrhea Associated symptoms: abdominal pain and vomiting        Home Medications Prior to Admission medications   Medication Sig Start Date End Date Taking? Authorizing  Provider  famotidine (PEPCID) 20 MG tablet Take 1 tablet (20 mg total) by mouth 2 (two) times daily. 02/24/22   Milton Ferguson, MD  HYDROcodone-acetaminophen (NORCO/VICODIN) 5-325 MG per tablet Take 1-2 tablets by mouth every 4 (four) hours as needed for severe pain. 03/01/14   Etta Quill, NP  Pseudoephedrine-Acetaminophen (SINUS PO) Take 1 tablet by mouth 2 (two) times daily as needed (for congestion).    [provider]      Allergies    Patient has no known allergies.    Review of Systems   Review of Systems  Gastrointestinal:  Positive for abdominal pain, diarrhea and vomiting.    Physical Exam Updated Vital Signs BP (!) 170/88   Pulse (!) 114   Temp 98 F (36.7 C) (Oral)   Resp 18   SpO2 95%  Physical Exam Vitals and nursing note reviewed.  Constitutional:      General: He is not in acute distress.    Appearance: He is ill-appearing.     Comments: Chronically ill appearing  HENT:     Head: Atraumatic.  Eyes:     Conjunctiva/sclera: Conjunctivae normal.  Cardiovascular:     Rate and Rhythm: Normal rate and regular rhythm.     Pulses: Normal pulses.     Heart sounds: No murmur heard. Pulmonary:     Effort: Pulmonary effort is normal. No respiratory distress.     Breath sounds: Normal breath sounds.  Abdominal:     General: Abdomen is flat. There is no distension.     Palpations: Abdomen is  soft.     Tenderness: There is abdominal tenderness in the epigastric area. There is no right CVA tenderness or left CVA tenderness.  Musculoskeletal:        General: Normal range of motion.     Cervical back: Normal range of motion.  Skin:    General: Skin is warm and dry.     Capillary Refill: Capillary refill takes less than 2 seconds.  Neurological:     General: No focal deficit present.     Mental Status: He is alert.  Psychiatric:        Mood and Affect: Mood normal.     ED Results / Procedures / Treatments   Labs (all labs ordered are listed, but only  abnormal results are displayed) Labs Reviewed  COMPREHENSIVE METABOLIC PANEL - Abnormal; Notable for the following components:      Result Value   Potassium <2.0 (*)    Chloride 95 (*)    Glucose, Bld 153 (*)    Creatinine, Ser 0.57 (*)    Calcium 8.3 (*)    Albumin 2.9 (*)    AST 396 (*)    ALT 107 (*)    Alkaline Phosphatase 497 (*)    Total Bilirubin 2.3 (*)    All other components within normal limits  CBC WITH DIFFERENTIAL/PLATELET - Abnormal; Notable for the following components:   WBC 15.3 (*)    RBC 3.87 (*)    Hemoglobin 11.3 (*)    HCT 33.3 (*)    Neutro Abs 14.5 (*)    Lymphs Abs 0.3 (*)    Abs Immature Granulocytes 0.09 (*)    All other components within normal limits  MAGNESIUM - Abnormal; Notable for the following components:   Magnesium 1.4 (*)    All other components within normal limits  GASTROINTESTINAL PANEL BY PCR, STOOL (REPLACES STOOL CULTURE)  C DIFFICILE QUICK SCREEN W PCR REFLEX    CULTURE, BLOOD (ROUTINE X 2)  CULTURE, BLOOD (ROUTINE X 2)  LIPASE, BLOOD  CBC WITH DIFFERENTIAL/PLATELET  RENAL FUNCTION PANEL  MAGNESIUM    EKG None  Radiology CT ABDOMEN PELVIS W CONTRAST  Result Date: 08/17/2022 CLINICAL DATA:  Abdominal pain, pancreatic cancer * Tracking Code: BO * EXAM: CT ABDOMEN AND PELVIS WITH CONTRAST TECHNIQUE: Multidetector CT imaging of the abdomen and pelvis was performed using the standard protocol following bolus administration of intravenous contrast. RADIATION DOSE REDUCTION: This exam was performed according to the departmental dose-optimization program which includes automated exposure control, adjustment of the mA and/or kV according to patient size and/or use of iterative reconstruction technique. CONTRAST:  148m OMNIPAQUE IOHEXOL 300 MG/ML  SOLN COMPARISON:  11/21/2021 FINDINGS: Lower chest: No acute abnormality. Hepatobiliary: Significant interval decrease in size of a cystic lesion of the liver dome, now measuring 1.8 x 1.4 cm,  previously 4.0 x 3.4 cm (series 2, image 13). Status post common bile duct stenting with intra and extrahepatic biliary ductal dilatation as well as post stenting pneumobilia. Pancreas: Similar appearance of a heterogeneous, hypodense lesion of the pancreatic head and uncinate measuring 4.6 x 3.0 cm (series 2, image 35). There is new, severe pancreatic ductal dilatation and parenchymal atrophy throughout the pancreas, the duct measuring up to 1.9 cm in the proximal body (series 2, image 31). Extensive adjacent inflammatory fat stranding and fluid. Spleen: Normal in size without significant abnormality. Adrenals/Urinary Tract: Adrenal glands are unremarkable. Kidneys are normal, without renal calculi, solid lesion, or hydronephrosis. Bladder is unremarkable. Stomach/Bowel: Stomach is within  normal limits. Diffuse edematous wall thickening of the small bowel and colon, particularly notable in the duodenal and cecum, although appearing to involve the entirety of the bowel (series 2, image 45, 75). Normal appendix. Long segment wall thickening Vascular/Lymphatic: Aortic atherosclerosis. Central portal vein, central superior mesenteric vein, and splenic vein are effaced by pancreatic head mass. No enlarged abdominal or pelvic lymph nodes. Reproductive: No mass or other significant abnormality. Other: No abdominal wall hernia. Anasarca. New small volume ascites. Small volume pneumoperitoneum in the lesser sac (series 2, image 25). Musculoskeletal: No acute or significant osseous findings. IMPRESSION: 1. Small volume pneumoperitoneum in the lesser sac, suggesting either proximal bowel or biliary perforation. Nidus of perforation is not directly visualized. 2. Similar appearance of a hypodense pancreatic head mass. New, severe pancreatic ductal dilatation and parenchymal atrophy. 3. Adjacent inflammatory fat stranding and fluid about the pancreas, consistent with acute pancreatitis. 4. There is additionally, diffuse  edematous wall thickening of the small bowel and colon, consistent with nonspecific infectious or inflammatory enterocolitis. 5. New small volume ascites and anasarca. 6. Status post common bile duct stenting with intra and extrahepatic biliary ductal dilatation as well as post stenting pneumobilia. 7. Significant decrease in size of a cystic lesion of the liver dome, of uncertain significance, this lesion not typical in appearance for a liver metastasis despite reported history of pancreatic cancer. These results were called by telephone at the time of interpretation on 08/17/2022 at 1:35 pm to [place], who verbally acknowledged these results. Aortic Atherosclerosis (ICD10-I70.0). Electronically Signed   By: Delanna Ahmadi M.D.   On: 08/17/2022 13:38   DG Chest Portable 1 View  Result Date: 08/17/2022 CLINICAL DATA:  Abdominal pain, vomiting and diarrhea since yesterday. History of pancreatic carcinoma. EXAM: PORTABLE CHEST 1 VIEW COMPARISON:  None Available. FINDINGS: Cardiac silhouette is normal in size. Normal mediastinal and hilar contours. Right anterior chest wall, internal jugular, Port-A-Cath has its tip lying at the caval atrial junction. Clear lungs.  No pleural effusion or pneumothorax. Skeletal structures are grossly intact. IMPRESSION: No active disease. Electronically Signed   By: Lajean Manes M.D.   On: 08/17/2022 12:32    Procedures Procedures    Medications Ordered in ED Medications  potassium chloride SA (KLOR-CON M) CR tablet 40 mEq (40 mEq Oral Patient Refused/Not Given 08/17/22 1249)  potassium chloride 10 mEq in 100 mL IVPB (10 mEq Intravenous New Bag/Given 08/17/22 1425)  magnesium sulfate IVPB 2 g 50 mL (has no administration in time range)  lactated ringers infusion (has no administration in time range)  prochlorperazine (COMPAZINE) injection 10 mg (has no administration in time range)  HYDROmorphone (DILAUDID) injection 1 mg (has no administration in time  range)  piperacillin-tazobactam (ZOSYN) IVPB 3.375 g (has no administration in time range)  piperacillin-tazobactam (ZOSYN) IVPB 3.375 g (has no administration in time range)  iohexol (OMNIPAQUE) 9 MG/ML oral solution (has no administration in time range)  iohexol (OMNIPAQUE) 9 MG/ML oral solution 500 mL (has no administration in time range)  ondansetron (ZOFRAN) injection 4 mg (4 mg Intravenous Given 08/17/22 1131)  lactated ringers bolus 1,000 mL (0 mLs Intravenous Stopped 08/17/22 1426)  HYDROmorphone (DILAUDID) injection 0.5 mg (0.5 mg Intravenous Given 08/17/22 1131)  iohexol (OMNIPAQUE) 300 MG/ML solution 100 mL (100 mLs Intravenous Contrast Given 08/17/22 1300)    ED Course/ Medical Decision Making/ A&P Clinical Course as of 08/17/22 1504  Sat Aug 17, 2022  1416 Dr. Marylyn Ishihara spoke with Dr Redmond Pulling in surgery regarding  patient's free air in abdomen. He advises attempting transfer to [facility] given patient receives his oncology care there.   Call placed to [facility] and heme/onc at [facility] is being notified. Waiting for call back [EC]  1421 Pt declined PO potassium as it "makes him sick"  [EC]  1421 Spoke with Dr. Jimmy Footman with heme onc at [facility]. He agrees to accept patient for transfer. I was informed that we will receive a phone call to [phone] when patient has a room assignment and ready for transport. [EC]    Clinical Course User Index [EC] Tonye Pearson, PA-C                           Medical Decision Making Amount and/or Complexity of Data Reviewed Labs: ordered. Radiology: ordered.  Risk Prescription drug management.   Social determinants of health:  Social History   Socioeconomic History   Marital status: Single    Spouse name: Not on file   Number of children: Not on file   Years of education: Not on file   Highest education level: Not on file  Occupational History   Not on file  Tobacco Use   Smoking status: Every Day    Types: Cigarettes   Smokeless tobacco: Not on  file  Substance and Sexual Activity   Alcohol use: No   Drug use: Yes    Types: Marijuana   Sexual activity: Not on file  Other Topics Concern   Not on file  Social History Narrative   Not on file   Social Determinants of Health   Financial Resource Strain: Not on file  Food Insecurity: Not on file  Transportation Needs: Not on file  Physical Activity: Not on file  Stress: Not on file  Social Connections: Not on file  Intimate Partner Violence: Not on file     Initial impression:  This patient presents to the ED for concern of epigastric pain with nausea and vomiting with current treatment for pancreatic cancer, this involves an extensive number of treatment options, and is a complaint that carries with it a high risk of complications and morbidity.   Differentials include metastasis, pancreatitis. SBO, perforation, gastritis.   Comorbidities affecting care:  Per HPI  Additional history obtained: Oncology notes reviewed  Lab Tests  I Ordered, reviewed, and interpreted labs and EKG.  The pertinent results include:  Critical potassium less than 2 AST and ALT elevated, improved from prior Mag 1.4 Lipase normal  Imaging Studies ordered:  I ordered imaging studies including  CT abdomen and pelvis with small volume pneumoperitoneum suggestive of a proximal bowel or biliary perforation.  New small volume ascites and anasarca.  Edematous wall infectious or inflammatory or colitis. I independently visualized and interpreted imaging and I agree with the radiologist interpretation.   EKG: Sinus tachycardia   Medicines ordered and prescription drug management:  I ordered medication including: Dilaudid 0.5 mg IV LR bolus 1 L Zofran 4 mg IV Potassium 40 mEq p.o.-declined by patient due to intolerance Potassium IV run x4 Reevaluation of the patient after these medicines showed that the patient stayed the same I have reviewed the patients home medicines and have made  adjustments as needed   ED Course/Re-evaluation: Patient is chronically ill-appearing although in no acute distress.  Vitals overall unremarkable.  He is satting at 100% on room air.  Progressively tachycardic throughout his stay up to 114.  He had some improvement with nausea with  IV Zofran although this returned.  On physical exam, he has significant epigastric tenderness to palpation.  No guarding.  Labs concerning for critical potassium of less than 2.  He was given p.o. potassium and IV potassium, however he declined p.o. potassium due to intolerance to the medication.  Additionally his magnesium was decreased at 1.4.  I requested admission and spoke with Dr. Marylyn Ishihara who came to evaluate the patient.  During his evaluation, he CT results returned with numerous concerning findings as described above.  He contacted Dr. Redmond Pulling with surgery directly who suggested transfer to [facility] given that this is where patient is receiving majority of his care.  If they refuse admission, we will admit him here. If that's the case, then Dr. Redmond Pulling recommends CT abdomen pelvis repeated with oral contrast to assess for location of perforation. I spoke with Dr. Jimmy Footman with heme-onc at [facility] who agrees to accept patient for transfer.  We were notified that it can take some time before a bed becomes available so we will continue to monitor him in the emergency department.  I contacted Dr. Redmond Pulling with surgery who advises discontinuation of the CT abdomen pelvis with oral contrast and allow [facility] to decide what imaging they would like to do.  Dr. Marylyn Ishihara put in IV antibiotics, blood cultures and additional pain medications to manage patient while he is here in the emergency department.  Patient care handed off to [facility] who will monitor patient and await phone call from [facility] for transfer.    Final Clinical Impression(s) / ED Diagnoses Final diagnoses:  Malignant neoplasm of pancreas, unspecified  location of malignancy ([place])  Hypokalemia  Pneumoperitoneum  Enterocolitis    Rx / DC Orders ED Discharge Orders     None         Tonye Pearson, PA-C 08/17/22 1504    Lajean Saver, MD 08/20/22 [phone]

## 2022-08-17 NOTE — ED Provider Notes (Signed)
Care transferred from [facility], PA-C at time of sign out. See their note for full assessment.   Briefly: Patient is 51 y.o. male who presents to the ED with concerns for abdominal pain, n/v/d.     Plan: Plan per previous PA-C: pending transfer to [facility].   Labs Reviewed  COMPREHENSIVE METABOLIC PANEL - Abnormal; Notable for the following components:      Result Value   Potassium <2.0 (*)    Chloride 95 (*)    Glucose, Bld 153 (*)    Creatinine, Ser 0.57 (*)    Calcium 8.3 (*)    Albumin 2.9 (*)    AST 396 (*)    ALT 107 (*)    Alkaline Phosphatase 497 (*)    Total Bilirubin 2.3 (*)    All other components within normal limits  CBC WITH DIFFERENTIAL/PLATELET - Abnormal; Notable for the following components:   WBC 15.3 (*)    RBC 3.87 (*)    Hemoglobin 11.3 (*)    HCT 33.3 (*)    Neutro Abs 14.5 (*)    Lymphs Abs 0.3 (*)    Abs Immature Granulocytes 0.09 (*)    All other components within normal limits  MAGNESIUM - Abnormal; Notable for the following components:   Magnesium 1.4 (*)    All other components within normal limits  GASTROINTESTINAL PANEL BY PCR, STOOL (REPLACES STOOL CULTURE)  C DIFFICILE QUICK SCREEN W PCR REFLEX    CULTURE, BLOOD (ROUTINE X 2)  CULTURE, BLOOD (ROUTINE X 2)  LIPASE, BLOOD  CBC WITH DIFFERENTIAL/PLATELET  RENAL FUNCTION PANEL  MAGNESIUM    Clinical Course as of 08/17/22 2132  Sat Aug 17, 2022  1416 Dr. Marylyn Ishihara spoke with Dr Redmond Pulling in surgery regarding patient's free air in abdomen. He advises attempting transfer to [facility] given patient receives his oncology care there.   Call placed to PAL and heme/onc at [facility] is being notified. Waiting for call back [EC]  1421 Pt declined PO potassium as it "makes him sick"  [EC]  1421 Spoke with Dr. Jimmy Footman with heme onc at [facility]. He agrees to accept patient for transfer. I was informed that we will receive a phone call to [phone] when patient has a room assignment and ready for transport. [EC]  1600  Discussed with patient and spouse regarding treatment plan consistent of transfer to [facility]. Answered all available questions. Pt agreeable at this time.  [SB]  [place] with PALS to request ED-ED transfer and spoke with Susie (Clinical RN) who noted that the patient cannot be transferred ED-ED unless the patient has a bed.  [SB]  [id number] Discussed with patient and wife regarding plans per PALS. Patient and wife agreeable [SB]    Clinical Course User Index [EC] Tonye Pearson, PA-C [SB] Josaphine Shimamoto A, PA-C     Pt to be transferred to [facility] for further evaluation. Patient hemodynamically stable at time of transfer. Discussed with patient and wife regarding transfer plans. Pt and wife agreeable.   This chart was dictated using voice recognition software, Dragon. Despite the best efforts of this provider to proofread and correct errors, errors may still occur which can change documentation meaning.   Genie Mirabal A, PA-C 08/17/22 2133    Regan Lemming, MD 08/17/22 2234

## 2022-08-17 NOTE — ED Triage Notes (Signed)
Pt BIB EMS from home, c/o abdominal pain, vomitting, and diarrhea since yesterday. Chills with no fever, A&O x4, hx of pancreatic cancer.   BP 124/50 P 90 RR 20 spO2 100%

## 2022-08-17 NOTE — ED Provider Notes (Signed)
Care assumed from previous provider at shift change.  See note for full HPI.  In summation 51 year old followed by [facility] for pancreatic neoplasm especially prior surgical intervention who came in for abdominal pain.  Was found to have enterocolitis as well as small volume pneumoperitoneum however perforation not directly visualized.  Previous provider had spoken with hospitalist as well as general surgery as well as [facility].  Patient has been accepted to [facility] however pending bed placement. Physical Exam  BP 111/75   Pulse 90   Temp 97.8 F (36.6 C)   Resp (!) 21   SpO2 95%   Physical Exam  Procedures  Procedures  ED Course / MDM   Clinical Course as of 08/17/22 2217  Sat Aug 17, 2022  1416 Dr. Marylyn Ishihara spoke with Dr Redmond Pulling in surgery regarding patient's free air in abdomen. He advises attempting transfer to [facility] given patient receives his oncology care there.   Call placed to PAL and heme/onc at [facility] is being notified. Waiting for call back [EC]  1421 Pt declined PO potassium as it "makes him sick"  [EC]  1421 Spoke with Dr. Jimmy Footman with heme onc at [facility]. He agrees to accept patient for transfer. I was informed that we will receive a phone call to [phone] when patient has a room assignment and ready for transport. [EC]  1600 Discussed with patient and spouse regarding treatment plan consistent of transfer to [facility]. Answered all available questions. Pt agreeable at this time.  [SB]  [place] with PALS to request ED-ED transfer and spoke with Susie (Clinical RN) who noted that the patient cannot be transferred ED-ED unless the patient has a bed.  [SB]  [id number] Discussed with patient and wife regarding plans per PALS. Patient and wife agreeable [SB]    Clinical Course User Index [EC] Tonye Pearson, PA-C [SB] Blue, Soijett A, PA-C   Medical Decision Making Amount and/or Complexity of Data Reviewed Labs: ordered. Radiology: ordered.  Risk Prescription drug  management.   Plan to follow-up in transfer.  Hemodynamically stable.  Per previous provider patient had a bed however awaiting transfer.

## 2022-08-17 NOTE — ED Notes (Signed)
Patient has acceptance to [facility] in [facility] room 735 gave report to [facility] RN, wakes transport team will call with a ETA.

## 2022-08-17 NOTE — Progress Notes (Signed)
Pharmacy Antibiotic Note  Collin Johnson. is a 51 y.o. male admitted on 08/17/2022 with Enterocolitis    - imaging show possibility of perforation.  Pharmacy has been consulted for zosyn dosing.  Plan: Zosyn 3.375g IV q8h (4 hour infusion).     Temp (24hrs), Avg:97.8 F (36.6 C), Min:97.8 F (36.6 C), Max:97.8 F (36.6 C)  Recent Labs  Lab 08/17/22 1141  WBC 15.3*  CREATININE 0.57*    CrCl cannot be calculated (Unknown ideal weight.).    No Known Allergies Thank you for allowing pharmacy to be a part of this patient's care.   Eudelia Bunch, Pharm.D 08/17/2022 2:23 PM

## 2022-08-17 NOTE — Consult Note (Addendum)
Initial Consultation Note   Patient: Collin Johnson. WUJ:[id number] DOB: 1971-01-28 PCP: [facility] DOA: 08/17/2022 DOS: the patient was seen and examined on 08/17/2022 Primary service: Lajean Saver, MD  Referring physician: Dr. Ashok Cordia Reason for consult: Hypokalemia  Assessment and Plan: Acute Pancreatitis Enterocolitis Pneumoperitoneum     - imaging show possibility of perforation     - I spoke with surgery; rec'd transfer to [facility] where the patient gets his surgical and cancer care; also rec'd CT ab/pelvis w/ oral contrast to pinpoint perforation     - I have relayed those recommendations to the EDPA     - recommend that he start fluids and abx (ordered placed)     - recommend blood cultures (orders placed)     - recommend anti-emetics, pain control (orders placed)     - we will follow with you while he holds in the ED  Hypokalemia Hypomagnesemia     - recommend replace K+/Mg2+ (orders placed)  Normocytic anemia     - no evidence of bleed, monitor  Elevated LFTs     - secondary to pancreatic cancer  TRH will continue to follow the patient.  HPI: Collin Johnson. is a 51 y.o. male with past medical history of pancreatic cancer, tobacco abuse, marijuana abuse. Presenting with abdominal pain, N/V/D. He reports his symptoms began about a week ago. The pain started in his LUQ and radiated down to his LLQ and left flank. He has had N/V and D during this time. He denies fevers. He denies sick contacts. He didn't try any medicines to help. When his symptoms did not improve, he decided to come to the ED today. He reports that he missed his last chemo session. He otherwise denies any other aggravating or alleviating factors.   Review of Systems: As mentioned in the history of present illness. All other systems reviewed and are negative.  PMHx Pancreatic CA Tobacco abuse Marijuana abuse  PSHx CBD stent placement  Social History:  reports that he has been  smoking cigarettes. He does not have any smokeless tobacco history on file. He reports current drug use. Drug: Marijuana. He reports that he does not drink alcohol.  No Known Allergies  FamHx History reviewed. No pertinent family history.  Prior to Admission medications   Medication Sig Start Date End Date Taking? Authorizing Provider  famotidine (PEPCID) 20 MG tablet Take 1 tablet (20 mg total) by mouth 2 (two) times daily. 02/24/22   Milton Ferguson, MD  HYDROcodone-acetaminophen (NORCO/VICODIN) 5-325 MG per tablet Take 1-2 tablets by mouth every 4 (four) hours as needed for severe pain. 03/01/14   Etta Quill, NP  Pseudoephedrine-Acetaminophen (SINUS PO) Take 1 tablet by mouth 2 (two) times daily as needed (for congestion).    [provider]    Physical Exam: Vitals:   08/17/22 1055 08/17/22 1336  BP: 129/71 (!) 159/84  Pulse: 96 (!) 108  Resp: 18 18  Temp: 97.8 F (36.6 C)   TempSrc: Oral   SpO2: 100% 98%   General: 51 y.o. male resting in bed in NAD Eyes: PERRL, normal sclera ENMT: Nares patent w/o discharge, orophaynx clear, dentition normal, ears w/o discharge/lesions/ulcers Neck: Supple, trachea midline Cardiovascular: tachy, +S1, S2, no g/r, 1/6 SEM, equal pulses throughout Respiratory: CTABL, no w/r/r, normal WOB GI: BS+, ND, soft, TTP LUQ/LLQ/left flank, no masses noted, no organomegaly noted MSK: No e/c/c Neuro: A&O x 3, no focal deficits Psyc: Appropriate interaction and affect, calm/cooperative  Data  Reviewed:   Results for orders placed or performed during the hospital encounter of 08/17/22 (from the past 24 hour(s))  Comprehensive metabolic panel     Status: Abnormal   Collection Time: 08/17/22 11:41 AM  Result Value Ref Range   Sodium 135 135 - 145 mmol/L   Potassium <2.0 (LL) 3.5 - 5.1 mmol/L   Chloride 95 (L) 98 - 111 mmol/L   CO2 29 22 - 32 mmol/L   Glucose, Bld 153 (H) 70 - 99 mg/dL   BUN 7 6 - 20 mg/dL   Creatinine, Ser 0.57 (L) 0.61 - 1.24  mg/dL   Calcium 8.3 (L) 8.9 - 10.3 mg/dL   Total Protein 6.7 6.5 - 8.1 g/dL   Albumin 2.9 (L) 3.5 - 5.0 g/dL   AST 396 (H) 15 - 41 U/L   ALT 107 (H) 0 - 44 U/L   Alkaline Phosphatase 497 (H) 38 - 126 U/L   Total Bilirubin 2.3 (H) 0.3 - 1.2 mg/dL   GFR, Estimated >60 >60 mL/min   Anion gap 11 5 - 15  CBC with Differential     Status: Abnormal   Collection Time: 08/17/22 11:41 AM  Result Value Ref Range   WBC 15.3 (H) 4.0 - 10.5 K/uL   RBC 3.87 (L) 4.22 - 5.81 MIL/uL   Hemoglobin 11.3 (L) 13.0 - 17.0 g/dL   HCT 33.3 (L) 39.0 - 52.0 %   MCV 86.0 80.0 - 100.0 fL   MCH 29.2 26.0 - 34.0 pg   MCHC 33.9 30.0 - 36.0 g/dL   RDW 14.3 11.5 - 15.5 %   Platelets 244 150 - 400 K/uL   nRBC 0.0 0.0 - 0.2 %   Neutrophils Relative % 94 %   Neutro Abs 14.5 (H) 1.7 - 7.7 K/uL   Lymphocytes Relative 2 %   Lymphs Abs 0.3 (L) 0.7 - 4.0 K/uL   Monocytes Relative 3 %   Monocytes Absolute 0.4 0.1 - 1.0 K/uL   Eosinophils Relative 0 %   Eosinophils Absolute 0.0 0.0 - 0.5 K/uL   Basophils Relative 0 %   Basophils Absolute 0.0 0.0 - 0.1 K/uL   Immature Granulocytes 1 %   Abs Immature Granulocytes 0.09 (H) 0.00 - 0.07 K/uL  Lipase, blood     Status: None   Collection Time: 08/17/22 11:41 AM  Result Value Ref Range   Lipase 27 11 - 51 U/L  Magnesium     Status: Abnormal   Collection Time: 08/17/22 12:42 PM  Result Value Ref Range   Magnesium 1.4 (L) 1.7 - 2.4 mg/dL   CT ab/pelvis 1. Small volume pneumoperitoneum in the lesser sac, suggesting either proximal bowel or biliary perforation. Nidus of perforation is not directly visualized. 2. Similar appearance of a hypodense pancreatic head mass. New, severe pancreatic ductal dilatation and parenchymal atrophy. 3. Adjacent inflammatory fat stranding and fluid about the pancreas, consistent with acute pancreatitis. 4. There is additionally, diffuse edematous wall thickening of the small bowel and colon, consistent with nonspecific infectious  or inflammatory enterocolitis. 5. New small volume ascites and anasarca. 6. Status post common bile duct stenting with intra and extrahepatic biliary ductal dilatation as well as post stenting pneumobilia. 7. Significant decrease in size of a cystic lesion of the liver dome, of uncertain significance, this lesion not typical in appearance for a liver metastasis despite reported history of pancreatic cancer. These results were called by telephone at the time of interpretation on 08/17/2022 at 1:35 pm to PA  Lorin Roemhildt, who verbally acknowledged these results. Aortic Atherosclerosis (ICD10-I70.0).    Family Communication: w/ wife at bedside Primary team communication: w/ EDPA/EDP Thank you very much for involving [place] in the care of your patient.  Time spent in coordination of this consult: 65 minutes   Author: Jonnie Finner, DO 08/17/2022 2:13 PM  For on call review [email or web address].

## 2022-08-18 LAB — RENAL FUNCTION PANEL
Albumin: 2.3 g/dL — ABNORMAL LOW (ref 3.5–5.0)
Anion gap: 9 (ref 5–15)
BUN: 8 mg/dL (ref 6–20)
CO2: 28 mmol/L (ref 22–32)
Calcium: 7.4 mg/dL — ABNORMAL LOW (ref 8.9–10.3)
Chloride: 97 mmol/L — ABNORMAL LOW (ref 98–111)
Creatinine, Ser: 0.5 mg/dL — ABNORMAL LOW (ref 0.61–1.24)
GFR, Estimated: >60 mL/min (ref >60)
Glucose, Bld: 99 mg/dL (ref 70–99)
Phosphorus: 3.6 mg/dL (ref 2.5–4.6)
Potassium: 2.4 mmol/L — CL (ref 3.5–5.1)
Sodium: 134 mmol/L — ABNORMAL LOW (ref 135–145)

## 2022-08-18 NOTE — ED Notes (Signed)
PA notified of potassium 2.4

## 2022-08-29 ENCOUNTER — Emergency Department (HOSPITAL_COMMUNITY)
Admission: EM | Admit: 2022-08-29 | Discharge: 2022-08-29 | Disposition: A | Discharge status: Home / Self Care | Payer: BLUE CROSS/BLUE SHIELD

## 2022-08-30 ENCOUNTER — Other Ambulatory Visit (HOSPITAL_BASED_OUTPATIENT_CLINIC_OR_DEPARTMENT_OTHER): Payer: Self-pay

## 2022-08-30 ENCOUNTER — Encounter (HOSPITAL_COMMUNITY): Payer: Self-pay

## 2022-08-30 ENCOUNTER — Emergency Department (HOSPITAL_COMMUNITY)
Admission: EM | Admit: 2022-08-30 | Discharge: 2022-08-30 | Disposition: A | Discharge status: Home / Self Care | Payer: BLUE CROSS/BLUE SHIELD | Attending: Emergency Medicine | Admitting: Emergency Medicine

## 2022-08-30 ENCOUNTER — Other Ambulatory Visit: Payer: Self-pay

## 2022-08-30 DIAGNOSIS — C259 Malignant neoplasm of pancreas, unspecified: Secondary | ICD-10-CM | POA: Diagnosis not present

## 2022-08-30 DIAGNOSIS — E876 Hypokalemia: Secondary | ICD-10-CM | POA: Insufficient documentation

## 2022-08-30 DIAGNOSIS — I10 Essential (primary) hypertension: Secondary | ICD-10-CM | POA: Diagnosis not present

## 2022-08-30 DIAGNOSIS — R109 Unspecified abdominal pain: Secondary | ICD-10-CM | POA: Diagnosis present

## 2022-08-30 DIAGNOSIS — Z515 Encounter for palliative care: Secondary | ICD-10-CM

## 2022-08-30 HISTORY — DX: Essential (primary) hypertension: I10

## 2022-08-30 HISTORY — DX: Malignant (primary) neoplasm, unspecified: C80.1

## 2022-08-30 LAB — CBC WITH DIFFERENTIAL/PLATELET
Abs Immature Granulocytes: 0.09 10*3/uL — ABNORMAL HIGH (ref 0.00–0.07)
Basophils Absolute: 0 10*3/uL (ref 0.0–0.1)
Basophils Relative: 0 %
Eosinophils Absolute: 0 10*3/uL (ref 0.0–0.5)
Eosinophils Relative: 0 %
HCT: 22.9 % — ABNORMAL LOW (ref 39.0–52.0)
Hemoglobin: 8.3 g/dL — ABNORMAL LOW (ref 13.0–17.0)
Immature Granulocytes: 1 %
Lymphocytes Relative: 10 %
Lymphs Abs: 1.3 10*3/uL (ref 0.7–4.0)
MCH: 29.3 pg (ref 26.0–34.0)
MCHC: 36.2 g/dL — ABNORMAL HIGH (ref 30.0–36.0)
MCV: 80.9 fL (ref 80.0–100.0)
Monocytes Absolute: 0.8 10*3/uL (ref 0.1–1.0)
Monocytes Relative: 6 %
Neutro Abs: 10.7 10*3/uL — ABNORMAL HIGH (ref 1.7–7.7)
Neutrophils Relative %: 83 %
Platelets: 205 10*3/uL (ref 150–400)
RBC: 2.83 MIL/uL — ABNORMAL LOW (ref 4.22–5.81)
RDW: 17.8 % — ABNORMAL HIGH (ref 11.5–15.5)
WBC: 12.9 10*3/uL — ABNORMAL HIGH (ref 4.0–10.5)
nRBC: 0 % (ref 0.0–0.2)

## 2022-08-30 LAB — COMPREHENSIVE METABOLIC PANEL
ALT: 69 U/L — ABNORMAL HIGH (ref 0–44)
AST: 137 U/L — ABNORMAL HIGH (ref 15–41)
Albumin: 1.6 g/dL — ABNORMAL LOW (ref 3.5–5.0)
Alkaline Phosphatase: 414 U/L — ABNORMAL HIGH (ref 38–126)
Anion gap: 8 (ref 5–15)
BUN: <5 mg/dL — ABNORMAL LOW (ref 6–20)
CO2: 30 mmol/L (ref 22–32)
Calcium: 7.7 mg/dL — ABNORMAL LOW (ref 8.9–10.3)
Chloride: 96 mmol/L — ABNORMAL LOW (ref 98–111)
Creatinine, Ser: <0.3 mg/dL — ABNORMAL LOW (ref 0.61–1.24)
Glucose, Bld: 81 mg/dL (ref 70–99)
Potassium: 2.6 mmol/L — CL (ref 3.5–5.1)
Sodium: 134 mmol/L — ABNORMAL LOW (ref 135–145)
Total Bilirubin: 14.3 mg/dL — ABNORMAL HIGH (ref 0.3–1.2)
Total Protein: 5.9 g/dL — ABNORMAL LOW (ref 6.5–8.1)

## 2022-08-30 LAB — LIPASE, BLOOD: Lipase: 27 U/L (ref 11–51)

## 2022-08-30 MED ORDER — DIAZEPAM 5 MG PO TABS
5.0000 mg | ORAL_TABLET | Freq: Three times a day (TID) | ORAL | 0 refills | Status: AC | PRN
  Filled 2022-08-30: qty 30, 10d supply, fill #0

## 2022-08-30 MED ORDER — MIRTAZAPINE 15 MG PO TBDP
15.0000 mg | ORAL_TABLET | Freq: Every day | ORAL | 0 refills | Status: AC
  Filled 2022-08-30 – 2022-08-31 (×2): qty 10, 10d supply, fill #0

## 2022-08-30 MED ORDER — LEVOFLOXACIN 750 MG PO TABS
750.0000 mg | ORAL_TABLET | Freq: Every day | ORAL | 0 refills | Status: AC
  Filled 2022-08-30: qty 10, 10d supply, fill #0

## 2022-08-30 MED ORDER — OXYCODONE HCL 20 MG/ML PO CONC
10.0000 mg | ORAL | 0 refills | Status: AC | PRN
  Filled 2022-08-30: qty 30, 10d supply, fill #0

## 2022-08-30 MED ORDER — DIAZEPAM 5 MG/ML IJ SOLN: 2.5000 mg | INTRAMUSCULAR | Status: DC | PRN

## 2022-08-30 MED ORDER — HYDROMORPHONE HCL 1 MG/ML IJ SOLN
0.5000 mg | Freq: Once | INTRAMUSCULAR | Status: AC
  Administered 2022-08-30: 0.5 mg via INTRAVENOUS
  Filled 2022-08-30: qty 1

## 2022-08-30 MED ORDER — ALBUMIN HUMAN 25 % IV SOLN: 12.5000 g | Freq: Once | INTRAVENOUS | Status: DC

## 2022-08-30 MED ORDER — MAGNESIUM SULFATE 2 GM/50ML IV SOLN: 2.0000 g | Freq: Once | INTRAVENOUS | Status: DC

## 2022-08-30 MED ORDER — FUROSEMIDE 10 MG/ML IJ SOLN: 40.0000 mg | Freq: Once | INTRAMUSCULAR | Status: DC

## 2022-08-30 MED ORDER — HYDROMORPHONE HCL 1 MG/ML IJ SOLN: 1.0000 mg | INTRAMUSCULAR | Status: DC | PRN

## 2022-08-30 MED ORDER — POTASSIUM CHLORIDE 10 MEQ/100ML IV SOLN: 10.0000 meq | INTRAVENOUS | Status: DC

## 2022-08-30 NOTE — ED Provider Triage Note (Signed)
Emergency Medicine Provider Triage Evaluation Note  Collin Johnson , a 51 y.o. male  was evaluated in triage.  Pt complains of abdominal pain. Hx stage IV pancreatic cancer on palliative care. Was just diagnosed with a bowel perforation that was non-operable due to patients low platelet count. States his pain is much worse over past 2 days with diarrhea and nausea.   Review of Systems  Positive:  Negative:   Physical Exam  BP (!) 154/94 (BP Location: Left Arm)   Pulse 88   Temp 98.3 F (36.8 C) (Oral)   Resp 18   Ht '5\' 8"'$  (1.727 m)   Wt 77.1 kg   SpO2 99%   BMI 25.84 kg/m  Gen:   Awake, no distress   Resp:  Normal effort  MSK:   Moves extremities without difficulty  Other:  Abdominal bruising and swelling noted.   Medical Decision Making  Medically screening exam initiated at 12:50 PM.  Appropriate orders placed.  Collin Johnson. was informed that the remainder of the evaluation will be completed by another provider, this initial triage assessment does not replace that evaluation, and the importance of remaining in the ED until their evaluation is complete.  Work-up initiated   Nestor Lewandowsky 08/30/22 1254

## 2022-08-30 NOTE — ED Provider Notes (Signed)
[place] DEPT Provider Note   CSN: [id number] Arrival date & time: 08/30/22  1200     History  Chief Complaint  Patient presents with   Abdominal Pain    Collin Crotty. is a 51 y.o. male.   Abdominal Pain Patient presents with abdominal pain.  Has stage IV pancreatic cancer.  Palliative care.  Had recent bowel perforation and was not an operative candidate due to severe thrombocytopenia.  Has been on IV antibiotics Rocephin and an antifungal for reported bacteremia.  Worse abdominal pain over the last 2 days.  Has some nausea and diarrhea.  Reviewing notes it appears patient sees palliative care and a consult was put in for hospice but not currently on hospice.  No fevers.  Patient is worried because his feet hurt and swelling in the feet.  Also had some new bruising on left lower flank.    Past Medical History:  Diagnosis Date   Cancer ([place])    Hypertension     Home Medications Prior to Admission medications   Medication Sig Start Date End Date Taking? Authorizing Provider  famotidine (PEPCID) 20 MG tablet Take 1 tablet (20 mg total) by mouth 2 (two) times daily. 02/24/22   Milton Ferguson, MD  HYDROcodone-acetaminophen (NORCO/VICODIN) 5-325 MG per tablet Take 1-2 tablets by mouth every 4 (four) hours as needed for severe pain. 03/01/14   Etta Quill, NP  Pseudoephedrine-Acetaminophen (SINUS PO) Take 1 tablet by mouth 2 (two) times daily as needed (for congestion).    [provider]      Allergies    Patient has no known allergies.    Review of Systems   Review of Systems  Gastrointestinal:  Positive for abdominal pain.    Physical Exam Updated Vital Signs BP (!) 156/95 (BP Location: Left Arm)   Pulse 84   Temp 97.9 F (36.6 C) (Oral)   Resp 16   Ht '5\' 8"'$  (1.727 m)   Wt 77.1 kg   SpO2 98%   BMI 25.84 kg/m  Physical Exam Vitals and nursing note reviewed.  Cardiovascular:     Rate and Rhythm: Normal rate.   Abdominal:     Comments: Mild tenderness rebound or guarding.  No hernia palpated.  Skin:    Comments: Mild area of redness left lower flank/upper pelvic area.  Reportedly had been more swollen.  Reportedly had more bruising.  Potentially had been hematoma.  Improving swelling at this time.  Neurological:     Mental Status: He is alert.     ED Results / Procedures / Treatments   Labs (all labs ordered are listed, but only abnormal results are displayed) Labs Reviewed  COMPREHENSIVE METABOLIC PANEL - Abnormal; Notable for the following components:      Result Value   Sodium 134 (*)    Potassium 2.6 (*)    Chloride 96 (*)    BUN <5 (*)    Creatinine, Ser <0.30 (*)    Calcium 7.7 (*)    Total Protein 5.9 (*)    Albumin 1.6 (*)    AST 137 (*)    ALT 69 (*)    Alkaline Phosphatase 414 (*)    Total Bilirubin 14.3 (*)    All other components within normal limits  CBC WITH DIFFERENTIAL/PLATELET - Abnormal; Notable for the following components:   WBC 12.9 (*)    RBC 2.83 (*)    Hemoglobin 8.3 (*)    HCT 22.9 (*)  MCHC 36.2 (*)    RDW 17.8 (*)    Neutro Abs 10.7 (*)    Abs Immature Granulocytes 0.09 (*)    All other components within normal limits  LIPASE, BLOOD  URINALYSIS, ROUTINE W REFLEX MICROSCOPIC    EKG None  Radiology No results found.  Procedures Procedures    Medications Ordered in ED Medications  HYDROmorphone (DILAUDID) injection 0.5 mg (0.5 mg Intravenous Given 08/30/22 1425)    ED Course/ Medical Decision Making/ A&P                           Medical Decision Making Risk Prescription drug management.   Patient with abdominal pain.  Nausea and vomiting.  Is on palliative care and not hospice.  I have reviewed recent discharge note.  Is on antibiotics at home.  Discussed with palliative medicine here, Dr. Hilma Favors is seeing the patient.  Also discussed with [facility] hospice who will be following the patient also.  Anemia with a hemoglobin 8.3  which is slightly down from 8.  9 recently.  White count also 12.9.  Platelets have improved up to 200 now.    Has a hypokalemia of 2.6.  This appears to be where he has been previously.  Also has low calcium and LFTs elevated.  Bilirubin also going up.  Patient states he wants to think about placement.  States he rather go home.  However Dr. Hilma Favors is still talking to family numbers.        Final Clinical Impression(s) / ED Diagnoses Final diagnoses:  Malignant neoplasm of pancreas, unspecified location of malignancy [facility])  Palliative care patient  Hypokalemia    Rx / DC Orders ED Discharge Orders     None         Davonna Belling, MD 08/30/22 1626

## 2022-08-30 NOTE — ED Notes (Signed)
Patient and family wants to leave. EDP informed. DC ordered.

## 2022-08-30 NOTE — ED Triage Notes (Signed)
Pt bib ems from home c/o abdominal and back pain. Hx pancreatic cancer 130/76  97% room air  76 hear rate 145 cbg

## 2022-08-30 NOTE — Progress Notes (Signed)
[facility] ED[facility] [employer] [facility]) Hospital Liaison note:  This is a pending outpatient-based Palliative Care patient. Will continue to follow for disposition.  Please call with any outpatient palliative questions or concerns.  Thank you, Lorelee Market, LPN [facility] Liaison [phone]

## 2022-08-30 NOTE — Consult Note (Signed)
Palliative Care Consultation Note  51 year old man with metastatic pancreatic cancer, status post pneumoperitoneum from bowel perforation following 10 cycles of gemcitabine.  He has been followed by oncology at [facility] and was transferred there on 08/18/2022 for treatment of his pneumoperitoneum however when he arrived the patient was deemed to be too unstable to undergo surgery and his platelet count was very low making him extremely high risk for any surgical intervention.  He was placed on IV antibiotics and antifungals and conservative measures initiated to treat his intraperitoneal infection.  He is no longer felt to be an appropriate candidate for chemotherapy nor could he tolerate it in his current condition.  He was discharged from [facility] with a recommendation for palliative care services and once he completed his IV antibiotics to transition into hospice care services.  He came to the ED with severe anasarca   I met with Ray, his sister Glenard Haring, his mother by phone and another sister to discuss his goals of care. I discussed his poor prognosis, terminal trajectory and reassured them that the things we could do for him include focus on his QOL and pain and symptom management.  Summary of Goals:  Ray does not want to be in the ED or the Hospital. He is scared and does not want to discuss his prognosis. His sisters share with me that he is resistant to taking meds at home and is sometimes irritable and difficult to care for.  Patient wants the swelling in his legs treated.  He is not eating nor does he have an appetite his family reports that he will also not take his medications as prescribed.  He is however drinking lots of water sometimes in excessive amounts because his sister says he has a very dry mouth. Patient has been receiving IV antibiotics prescribed at [facility] the IV antibiotics wishes to be continued for a total of 3 weeks is unclear what the start date was but he has certainly  been receiving them for at least 2 weeks which is sufficient for the treatment of a Klebsiella bacteremia.  This is underlying source of infection was not treated surgically and still remains I had offered to start him on an oral antibiotic that had coverage for this bacteria they agreed to this but then later following my consultation stated that they wanted to continue receiving the IV antibiotics.  This negatively impacts her ability to enroll in hospice services and to get the much-needed support at home.  The patient is making this decision likely in conflict with his sisters and his mother desire.  His family needs support in caring for him but he is unaware of how much they require to handle his medications and encouraged him to seek treatment for the things that are treatable.  Recommendations: I spent an extensive amount of time discussing the care options including hospice services with the patient his sister and his mother in the ER.  We discussed his goals of care his overall poor prognosis and the fact that this is a universally fatal cancer that will likely progress very quickly and he is looking into a very short survival.  His sisters and mother describe having difficulty caring for him at home and are in much need of support from in-home services I explained to them that hospice care would provide the highest possible level of in-home support they also inquired about [facility] and transitioning to a hospice home when that time came.  We all agreed  on a hospice referral and also that we would transition him to an oral antibiotic from the IV that he has been receiving at home.  After this decision was made I contacted hospice and when hospice went to the room to discuss with the patient and family they had changed their mind and no longer want hospice services.  Given the amount of time I spent with him discussing goals of care I do not feel like additional conversation will change the outcome  and the choices that he is made at this time.  I have asked the hospice team to follow-up tomorrow morning to see if they can assist this patient and family with their care needs. Symptom management I have ordered a run of albumin and Lasix along with potassium supplementation and magnesium supplementation at the patient and family's request.  They understand that ongoing monitoring of labs and these values will not likely improve the length of his life or the quality of his life.  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  He also has very poor nutritional intake which is worsening his situation.  In addition he is drinking large volumes of fluid which is contributed to third spacing of fluid in his lower extremities.  The patient is requesting something to help him with sleep as are the family members they say that he has difficulty sleeping at night and sometimes gets very agitated.  Gust with him the need for sedation and also for improved pain control.  Hopeful that hospice could get involved and help manage the symptoms once he left the ED this will not be possible based on the current decision that they have made however I will have hospice follow-up and palliative care services can continue to see him and make that transition when appropriate. I also discussed the concept of CODE STATUS with them and why DO NOT RESUSCITATE order would be most appropriate in his situation with terminal cancer.  They understand this but the patient is not been willing to make a decision regarding his CODE STATUS.  This is a man who has refused taking most of his medications at home and that does not want to be in the ED and would probably not choose CPR and a life support machine. CPR at death would not be helpful or indicated in this gentleman.  Meds entered and recommend discharge from ED once lasix KCl and albumin are given.  Hospice will follow up in AM.  Lane Hacker, DO Palliative Medicine   Time: 120 minutes

## 2022-08-31 ENCOUNTER — Other Ambulatory Visit (HOSPITAL_BASED_OUTPATIENT_CLINIC_OR_DEPARTMENT_OTHER): Payer: Self-pay

## 2022-09-02 ENCOUNTER — Other Ambulatory Visit (HOSPITAL_COMMUNITY): Payer: Self-pay

## 2022-09-02 ENCOUNTER — Other Ambulatory Visit (HOSPITAL_BASED_OUTPATIENT_CLINIC_OR_DEPARTMENT_OTHER): Payer: Self-pay

## 2022-10-21 DEATH — deceased
